# Patient Record
Sex: Male | Born: 2000
Health system: Southern US, Community
[De-identification: ages and names within clinical notes are randomized; demographics above are authoritative.]

## PROBLEM LIST (undated history)

## (undated) DIAGNOSIS — Q631 Lobulated, fused and horseshoe kidney: Secondary | ICD-10-CM

## (undated) DIAGNOSIS — F913 Oppositional defiant disorder: Secondary | ICD-10-CM

## (undated) DIAGNOSIS — R4689 Other symptoms and signs involving appearance and behavior: Secondary | ICD-10-CM

## (undated) HISTORY — PX: HERNIA REPAIR: SHX51

## (undated) HISTORY — PX: CIRCUMCISION: SUR203

---
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
## 2022-10-08 IMAGING — MR MR HEAD W/O CM
10 of 12 series · 25 of 48 positions shown · non-contrast
Comparison: Head CT from earlier today

CLINICAL DATA: Seizure.  Questionable pituitary abnormality by CT

EXAM:
MRI HEAD WITHOUT CONTRAST
TECHNIQUE: Multiplanar, multiecho pulse sequences of the brain and surrounding
structures were obtained without intravenous contrast.

[Series 2: DWI · axial · 3.0mm · 0.94mm/px · z∈[-91,+42]mm · 6 of 98 slices shown (1 of 2)]
[im 1/98]
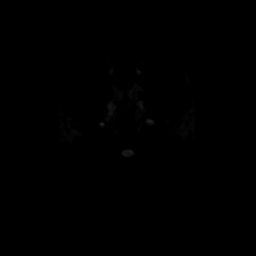
[im 20/98]
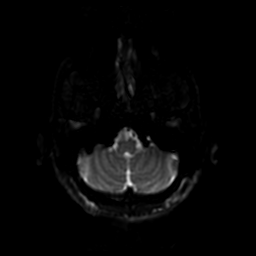
[im 39/98]
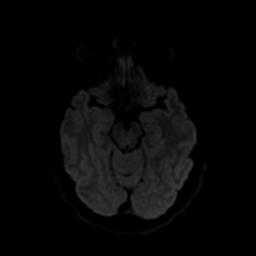
[im 59/98]
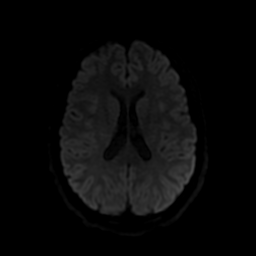
[im 78/98]
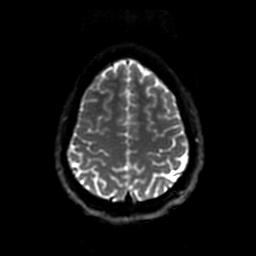
[im 98/98]
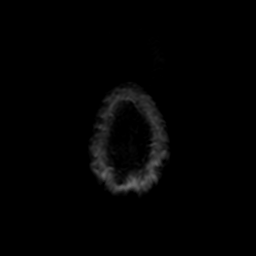

[Series 3: FLAIR · sagittal · 5.0mm · 0.23mm/px · 1 of 23 slices shown (1 of 3)]
[im 1/23]
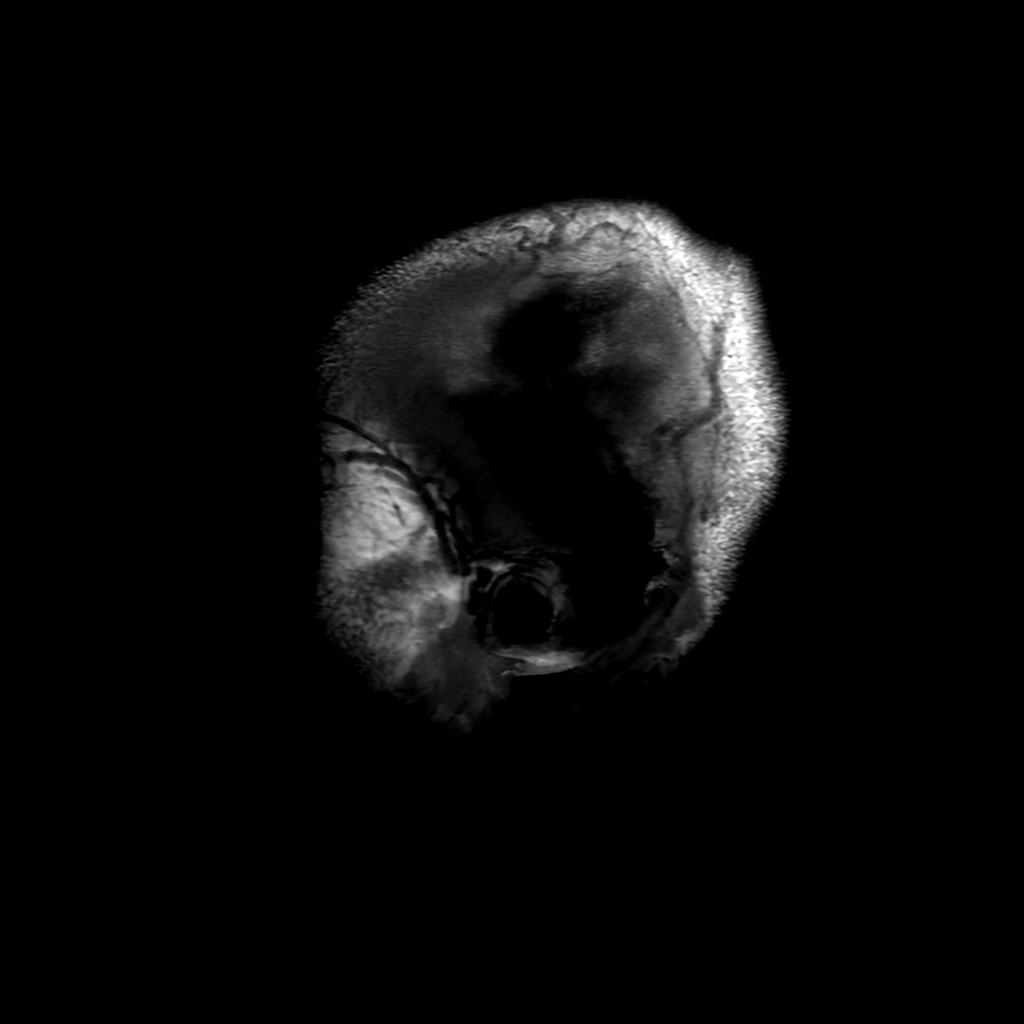

[Series 4: T2 · axial · 5.0mm · 0.23mm/px · 1 of 25 slices shown (1 of 2)]
[im 1/25]
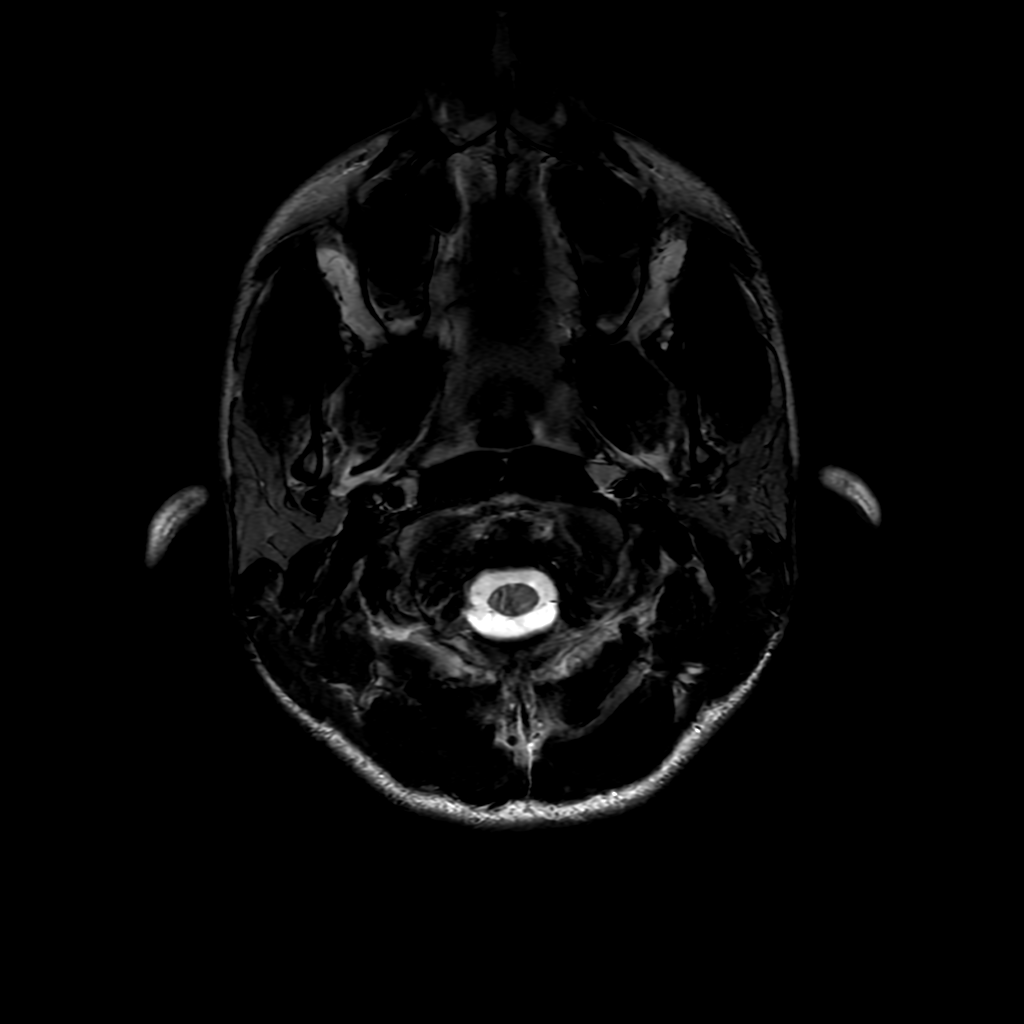

[Series 5: FLAIR · axial · 3.0mm · 0.41mm/px · 1 of 25 slices shown (2 of 3)]
[im 1/25]
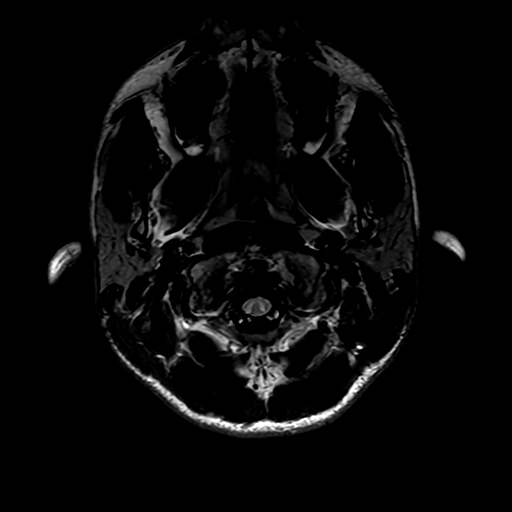

[Series 6: (person_name) · axial · 3.0mm · 0.47mm/px · z∈[-93,+42]mm · 5 of 100 slices shown]
[im 1/100]
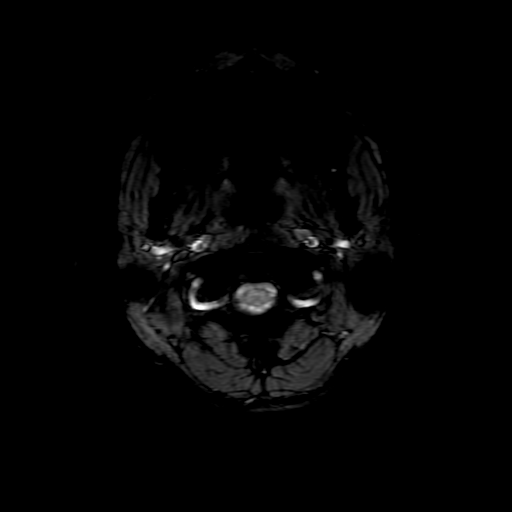
[im 25/100]
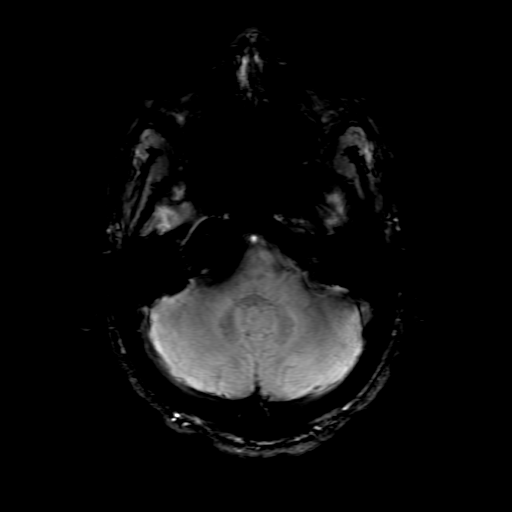
[im 50/100]
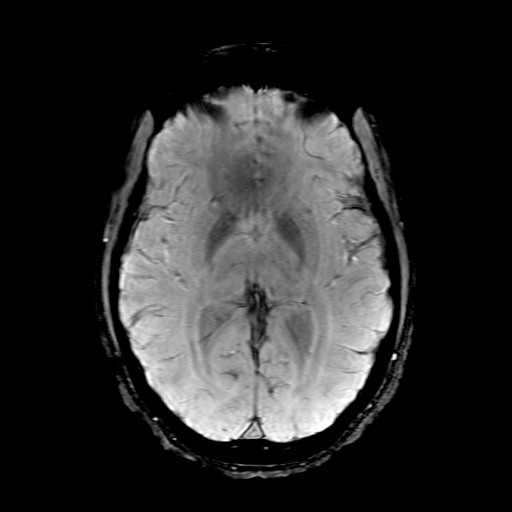
[im 75/100]
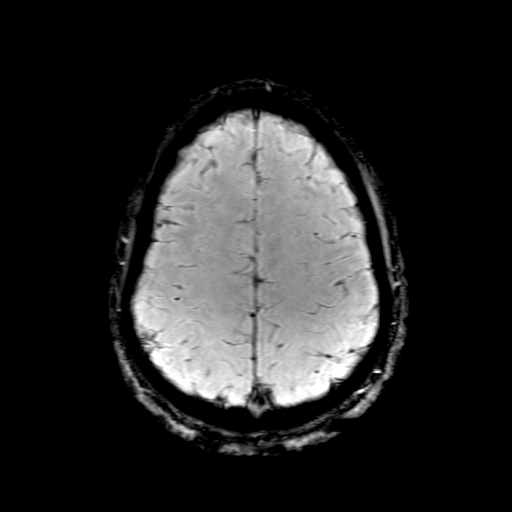
[im 100/100]
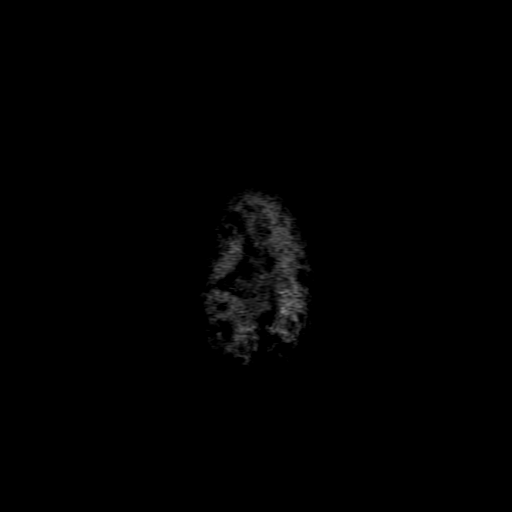

[Series 8: DWI · coronal · 4.0mm · 0.94mm/px · 4 of 68 slices shown (2 of 2)]
[im 1/68]
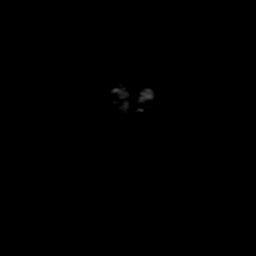
[im 23/68]
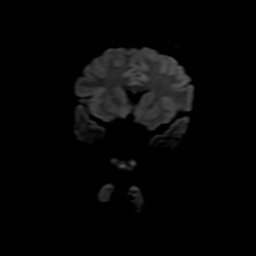
[im 45/68]
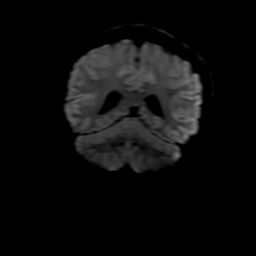
[im 68/68]
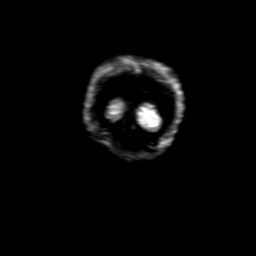

[Series 10: FLAIR · oblique · 3.0mm · 0.39mm/px · 1 of 28 slices shown (3 of 3)]
[im 1/28]
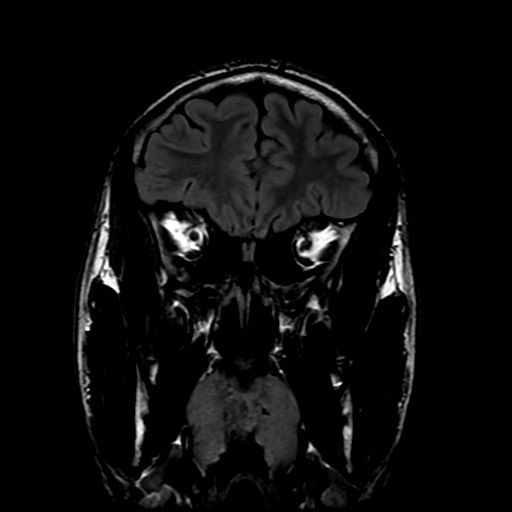

[Series 12: T2 · oblique · 3.0mm · 0.35mm/px · 1 of 28 slices shown (2 of 2)]
[im 1/28]
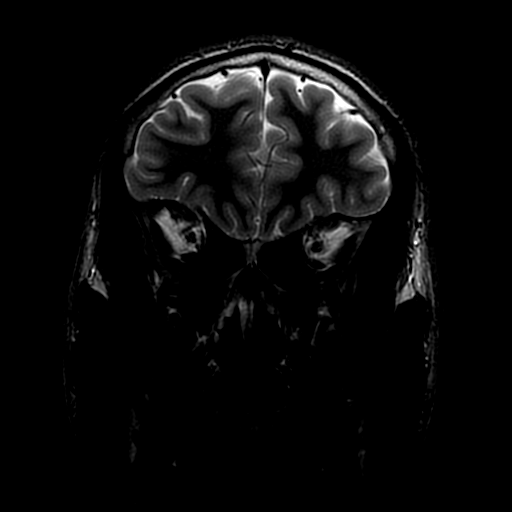

[Series 250: ADC · axial · 3.0mm · 0.94mm/px · z∈[-91,+42]mm · 3 of 49 slices shown (1 of 2)]
[im 1/49]
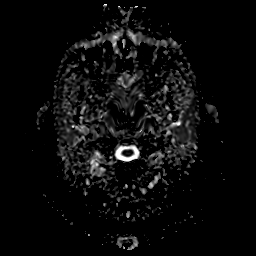
[im 25/49]
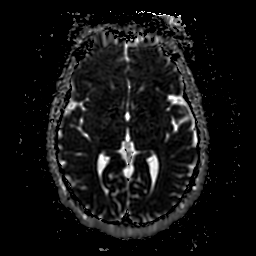
[im 49/49]
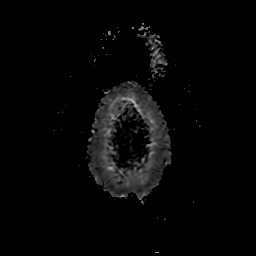

[Series 850: ADC · coronal · 4.0mm · 0.94mm/px · 2 of 34 slices shown (2 of 2)]
[im 1/34]
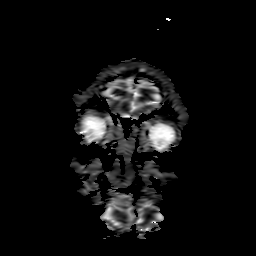
[im 34/34]
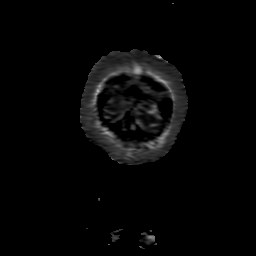

[25 of 48 positions shown; findings below may reference images not displayed]

FINDINGS: Brain: No infarction, hemorrhage, hydrocephalus, extra-axial
collection or mass lesion. Normal brain morphology and volume.
Normal pituitary gland appearance

Vascular: Normal flow voids

Skull and upper cervical spine: Normal marrow signal

Sinuses/Orbits: Negative
IMPRESSION: Normal brain MRI. No pituitary enlargement or visible seizure focus.

## 2022-10-08 IMAGING — CT CT HEAD W/O CM
4 series · 16 of 47 positions shown, 18 images · non-contrast
Comparison: CT head 06/18/2006

CLINICAL DATA: Seizure, head injury

EXAM:
CT HEAD WITHOUT CONTRAST
CT CERVICAL SPINE WITHOUT CONTRAST
TECHNIQUE: Multidetector CT imaging of the head and cervical spine was
performed following the standard protocol without intravenous
contrast. Multiplanar CT image reconstructions of the cervical spine
were also generated.

[Series 3: head wo · axial · 0.42mm/px · z∈[-210,-90]mm · 7 of 32 slices shown, 9 images]
[im 4/32  brain]
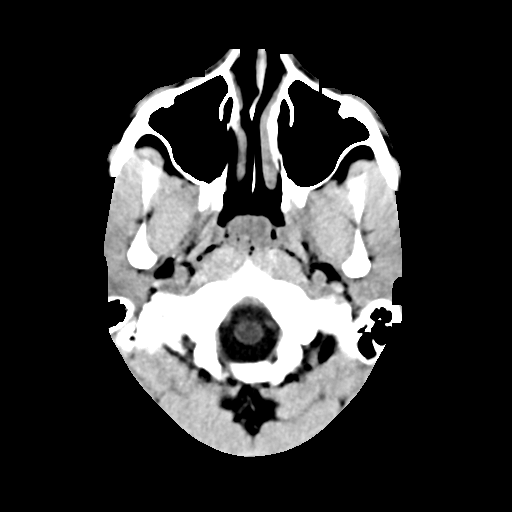
[im 4/32  bone]
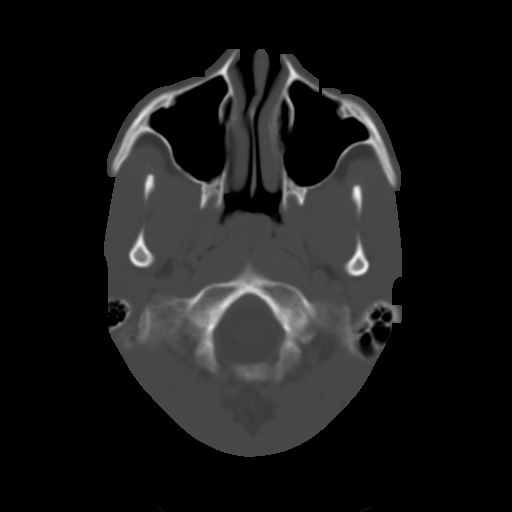
[im 8/32  brain]
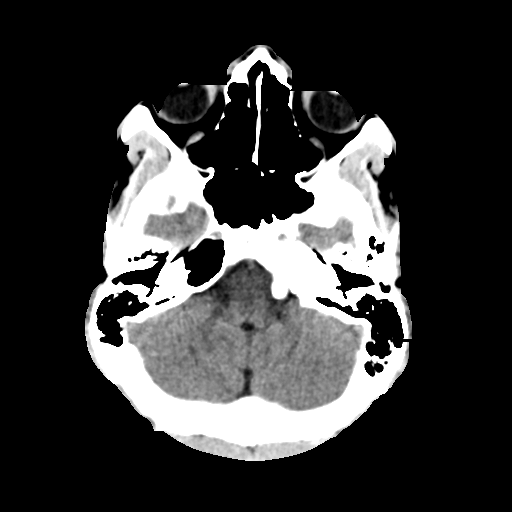
[im 12/32  brain]
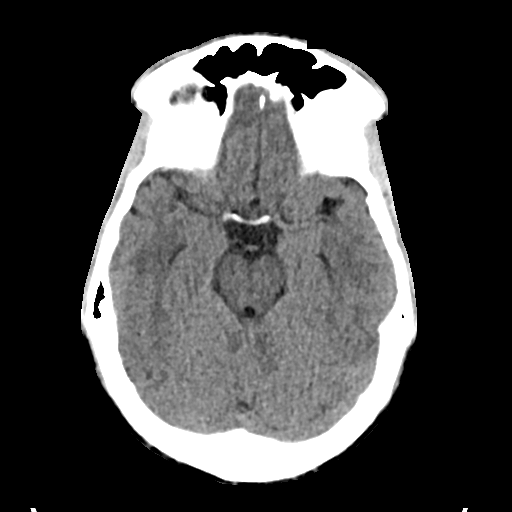
[im 16/32  brain]
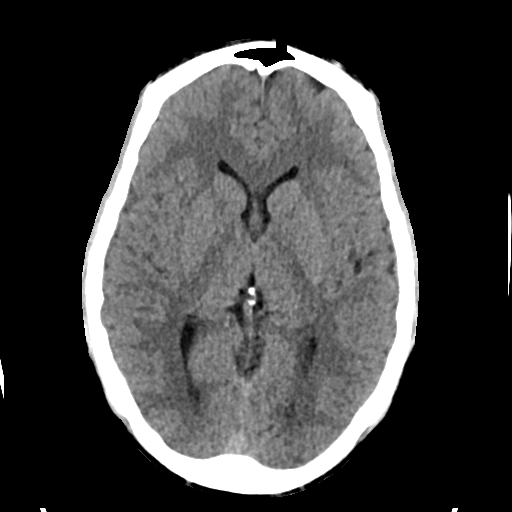
[im 20/32  brain]
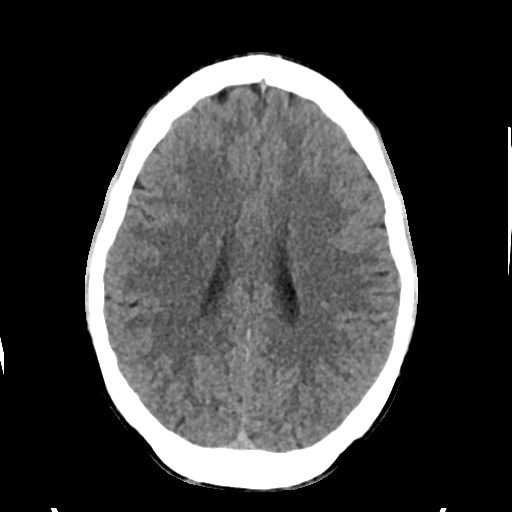
[im 20/32  bone]
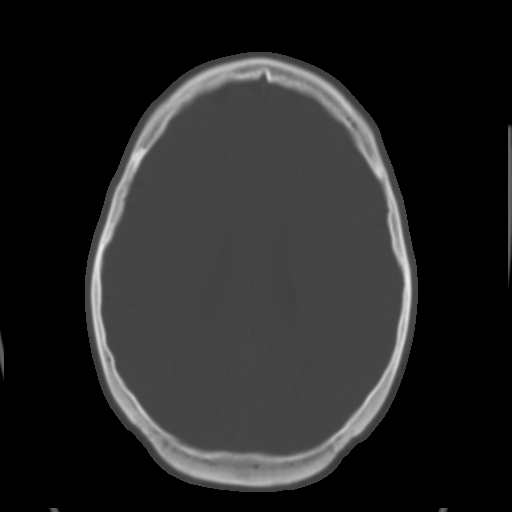
[im 24/32  brain]
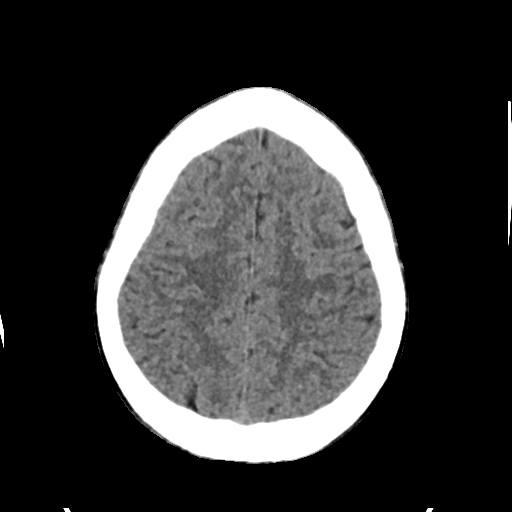
[im 28/32  brain]
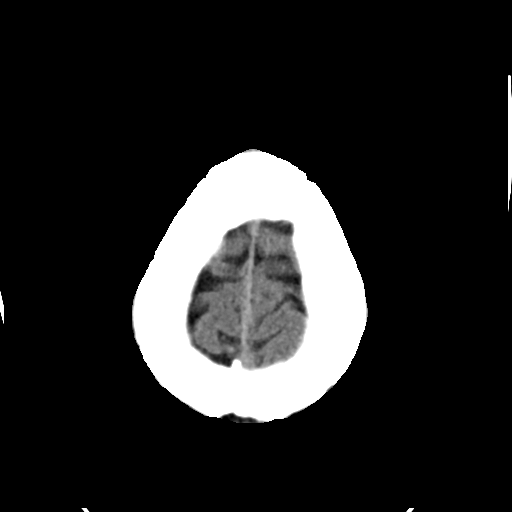

[Series 4: head bone · axial · 0.42mm/px · z∈[-211,-179]mm · 3 of 80 slices shown]
[im 8/80  bone]
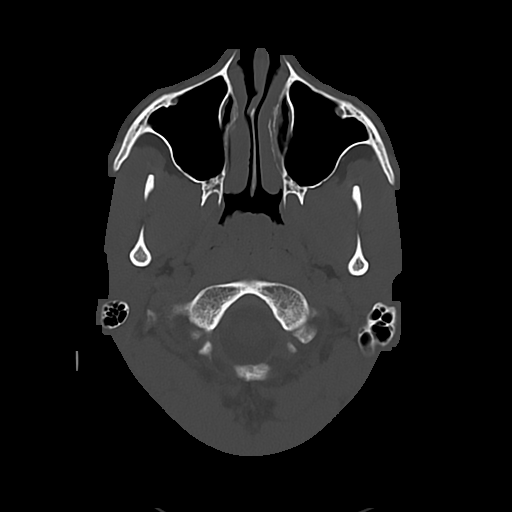
[im 16/80  bone]
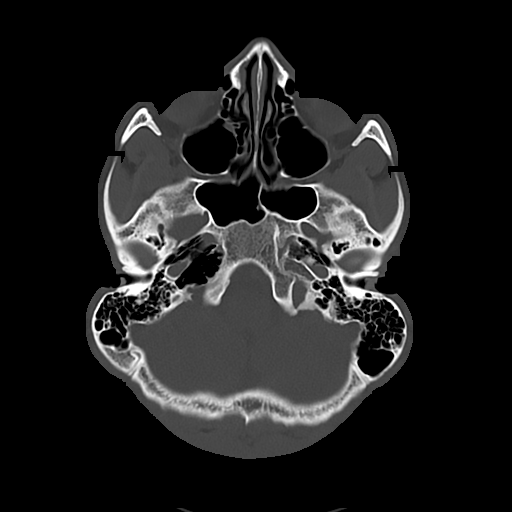
[im 24/80  bone]
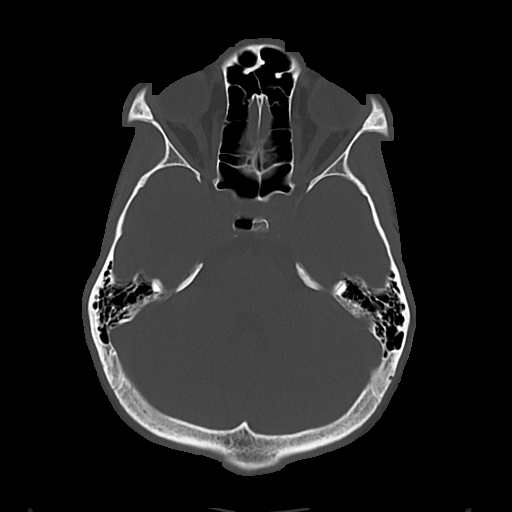

[Series 5: cor soft · coronal · 0.33mm/px · 3 of 73 slices shown]
[im 25/73  brain]
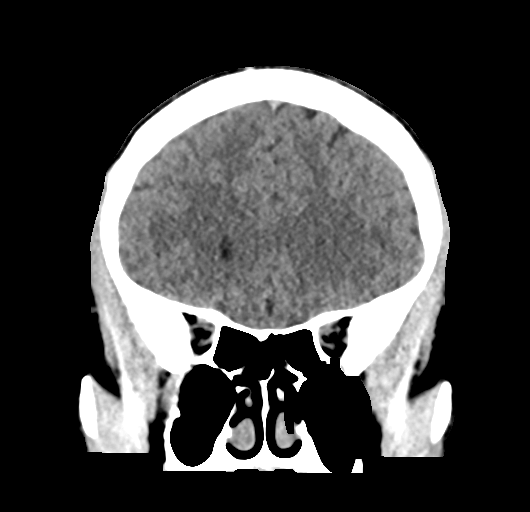
[im 33/73  brain]
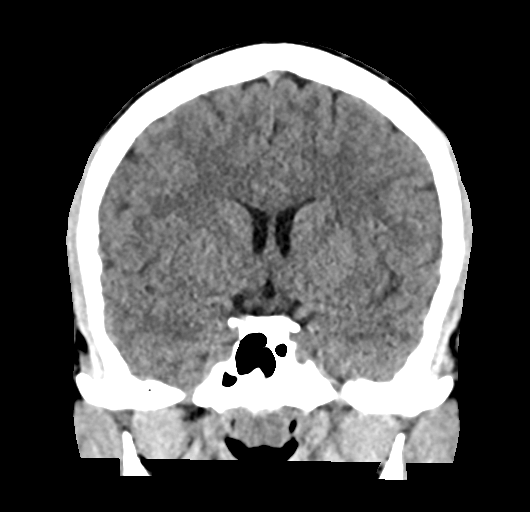
[im 41/73  brain]
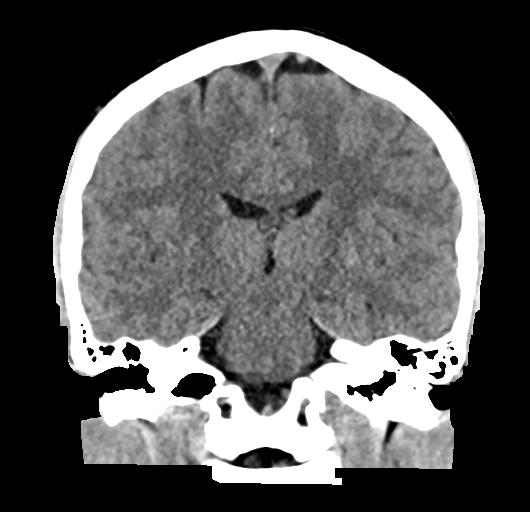

[Series 6: sag soft · sagittal · 0.33mm/px · 3 of 59 slices shown]
[im 20/59  brain]
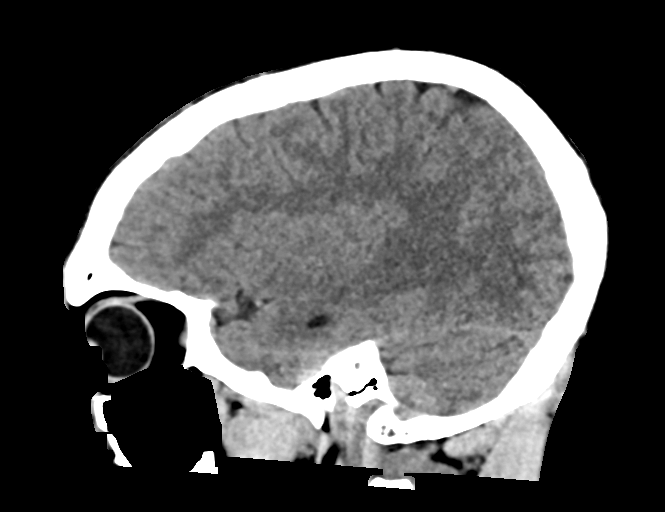
[im 30/59  brain]
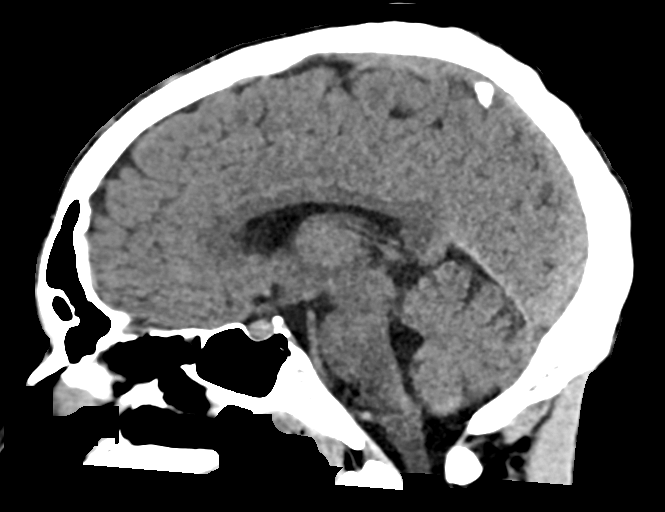
[im 39/59  brain]
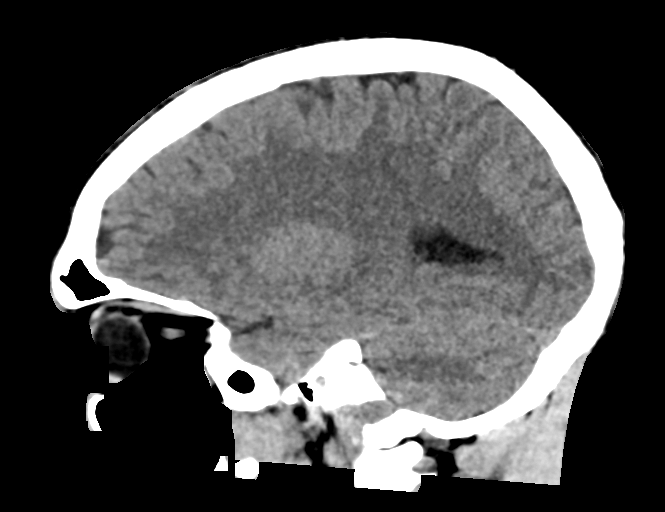

[16 of 47 positions shown; findings below may reference images not displayed]

FINDINGS: CT HEAD FINDINGS

Brain: There is normal anatomic configuration of the brain. No
abnormal mass effect or midline shift. There is development of a
hyperdense 9 mm nodule within the sella which may represent an
underlying pituitary macro adenoma. This appears new since prior
examination. No other abnormal intra or extra-axial mass lesion. No
extra-axial fluid collection. No acute intracranial hemorrhage or
infarct. Ventricular size is normal. Cerebellum is unremarkable.

Vascular: No asymmetric hyperdense vasculature at the skull base.

Skull: Intact

Sinuses/Orbits: The paranasal sinuses are clear. The orbits are
unremarkable.

Other: The mastoid air cells and middle ear cavities are clear.

CT CERVICAL SPINE FINDINGS

Alignment: Normal.

Skull base and vertebrae: No acute fracture. No primary bone lesion
or focal pathologic process.

Soft tissues and spinal canal: No prevertebral fluid or swelling. No
visible canal hematoma.

Disc levels: Vertebral body height and intervertebral disc heights
are preserved. The spinal canal is widely patent. No significant
uncovertebral or facet arthrosis is identified. No significant
neuroforaminal narrowing is seen.

Upper chest: Unremarkable

Other: None
IMPRESSION: No acute intracranial abnormality.  No calvarial fracture.

Nodular enlargement and hyperdensity of the pituitary gland possibly
related to an underlying pituitary macro adenoma. This could be
confirmed with MRI examination.

No acute fracture or listhesis of the cervical spine.
